# Patient Record
Sex: Female | Born: 1970 | Race: White | Hispanic: No | Smoking: Never smoker
Health system: Southern US, Community
[De-identification: ages and names within clinical notes are randomized; demographics above are authoritative.]

## PROBLEM LIST (undated history)

## (undated) DIAGNOSIS — F329 Major depressive disorder, single episode, unspecified: Secondary | ICD-10-CM

## (undated) DIAGNOSIS — R001 Bradycardia, unspecified: Secondary | ICD-10-CM

## (undated) DIAGNOSIS — F32A Depression, unspecified: Secondary | ICD-10-CM

## (undated) DIAGNOSIS — F319 Bipolar disorder, unspecified: Secondary | ICD-10-CM

## (undated) HISTORY — PX: ABDOMINAL HYSTERECTOMY: SHX81

---

## 2018-03-23 ENCOUNTER — Emergency Department
Admission: EM | Admit: 2018-03-23 | Discharge: 2018-03-23 | Disposition: A | Discharge status: Home / Self Care | Payer: Self-pay | Attending: Emergency Medicine | Admitting: Emergency Medicine

## 2018-03-23 ENCOUNTER — Emergency Department: Payer: Self-pay

## 2018-03-23 ENCOUNTER — Other Ambulatory Visit: Payer: Self-pay

## 2018-03-23 DIAGNOSIS — R0789 Other chest pain: Secondary | ICD-10-CM | POA: Insufficient documentation

## 2018-03-23 DIAGNOSIS — R079 Chest pain, unspecified: Secondary | ICD-10-CM

## 2018-03-23 LAB — CBC
HCT: 39.9 % (ref 36.0–46.0)
Hemoglobin: 12.4 g/dL (ref 12.0–15.0)
MCH: 26.2 pg (ref 26.0–34.0)
MCHC: 31.1 g/dL (ref 30.0–36.0)
MCV: 84.4 fL (ref 80.0–100.0)
NRBC: 0 % (ref 0.0–0.2)
PLATELETS: 243 10*3/uL (ref 150–400)
RBC: 4.73 MIL/uL (ref 3.87–5.11)
RDW: 14.1 % (ref 11.5–15.5)
WBC: 11.3 10*3/uL — AB (ref 4.0–10.5)

## 2018-03-23 LAB — BASIC METABOLIC PANEL
Anion gap: 9 (ref 5–15)
BUN: 13 mg/dL (ref 6–20)
CHLORIDE: 108 mmol/L (ref 98–111)
CO2: 23 mmol/L (ref 22–32)
CREATININE: 0.69 mg/dL (ref 0.44–1.00)
Calcium: 9.1 mg/dL (ref 8.9–10.3)
Glucose, Bld: 120 mg/dL — ABNORMAL HIGH (ref 70–99)
Potassium: 3.6 mmol/L (ref 3.5–5.1)
SODIUM: 140 mmol/L (ref 135–145)

## 2018-03-23 LAB — TROPONIN I

## 2018-03-23 MED ORDER — DIAZEPAM 5 MG PO TABS: 5.0000 mg | ORAL_TABLET | Freq: Three times a day (TID) | ORAL | 0 refills | Status: AC | PRN

## 2018-03-23 MED ORDER — IBUPROFEN 600 MG PO TABS: 600.0000 mg | ORAL_TABLET | Freq: Three times a day (TID) | ORAL | 0 refills | Status: AC | PRN

## 2018-03-23 NOTE — ED Notes (Signed)
Green lav and red tubes sent to lab. Green and lav little short

## 2018-03-23 NOTE — ED Triage Notes (Signed)
Pt comes via POV from work with c/o chest pain. Pt states central stabbing chest pain that radiated into her left arm. Pt states hx of bradycardia and felt her HR drop while at work. Pt also states dizziness and nausea.

## 2018-03-23 NOTE — ED Notes (Signed)
Patient transported to X-ray 

## 2018-03-23 NOTE — ED Provider Notes (Signed)
[facility] Emergency Department Provider Note       Time seen: ----------------------------------------- 12:08 PM on 03/23/2018 -----------------------------------------   I have reviewed the triage vital signs and the nursing notes.  HISTORY   Chief Complaint Chest Pain    HPI Nicole Bradley is a 47 y.o. female with no significant past medical history who presents to the ED for chest pain.  Patient states she has had central stabbing chest pain that radiates into her left arm.  She also has had some dizziness and nausea, felt her heart rate dropped while she was at work today.  Patient was told in the past at [facility] that she had low heart rate but that it was not worrisome and there was no treatment.  Nothing makes her symptoms better or worse.  History reviewed. No pertinent past medical history.  There are no active problems to display for this patient.   History reviewed. No pertinent surgical history.  Allergies Oxycodone and Tramadol  Social History Social History   Tobacco Use  . Smoking status: Not on file  Substance Use Topics  . Alcohol use: Not on file  . Drug use: Not on file   Review of Systems Constitutional: Negative for fever. Cardiovascular: Positive for chest pain, slow heart rate Respiratory: Negative for shortness of breath. Gastrointestinal: Negative for abdominal pain, vomiting and diarrhea. Musculoskeletal: Negative for back pain. Skin: Negative for rash. Neurological: Negative for headaches, focal weakness or numbness.  All systems negative/normal/unremarkable except as stated in the HPI  ____________________________________________   PHYSICAL EXAM:  VITAL SIGNS: ED Triage Vitals  Enc Vitals Group     BP 03/23/18 1159 119/84     Pulse Rate 03/23/18 1159 87     Resp 03/23/18 1159 19     Temp 03/23/18 1159 97.8 F (36.6 C)     Temp Source 03/23/18 1159 Oral     SpO2 03/23/18 1159 97 %     Weight 03/23/18 1200  260 lb (117.9 kg)     Height 03/23/18 1200 5\' 2"  (1.575 m)     Head Circumference --      Peak Flow --      Pain Score 03/23/18 1159 2     Pain Loc --      Pain Edu? --      Excl. in GC? --    Constitutional: Alert and oriented. Well appearing and in no distress. Eyes: Conjunctivae are normal. Normal extraocular movements. ENT   Head: Normocephalic and atraumatic.   Nose: No congestion/rhinnorhea.   Mouth/Throat: Mucous membranes are moist.   Neck: No stridor. Cardiovascular: Normal rate, regular rhythm. No murmurs, rubs, or gallops. Respiratory: Normal respiratory effort without tachypnea nor retractions. Breath sounds are clear and equal bilaterally. No wheezes/rales/rhonchi. Gastrointestinal: Soft and nontender. Normal bowel sounds Musculoskeletal: Nontender with normal range of motion in extremities. No lower extremity tenderness nor edema. Neurologic:  Normal speech and language. No gross focal neurologic deficits are appreciated.  Skin:  Skin is warm, dry and intact. No rash noted. Psychiatric: Mood and affect are normal. Speech and behavior are normal.  ____________________________________________  EKG: Interpreted by me.  Sinus rhythm rate 89 bpm, normal PR interval, normal QRS, normal QT  ____________________________________________  ED COURSE:  As part of my medical decision making, I reviewed the following data within the electronic MEDICAL RECORD NUMBER History obtained from family if available, nursing notes, old chart and ekg, as well as notes from prior ED visits. Patient presented for  chest pain with reports of low heart rate, we will assess with labs and imaging as indicated at this time.   Procedures ____________________________________________   LABS (pertinent positives/negatives)  Labs Reviewed  BASIC METABOLIC PANEL - Abnormal; Notable for the following components:      Result Value   Glucose, Bld 120 (*)    All other components within normal  limits  CBC - Abnormal; Notable for the following components:   WBC 11.3 (*)    All other components within normal limits  TROPONIN I  TROPONIN I    RADIOLOGY Images were viewed by me  Chest x-ray Is unremarkable ____________________________________________  DIFFERENTIAL DIAGNOSIS   Musculoskeletal pain, GERD, anxiety, unstable angina, MI, PE, pneumothorax  FINAL ASSESSMENT AND PLAN  Chest pain   Plan: The patient had presented for chest pain. Patient's labs were negative including repeat troponin testing. Patient's imaging was negative.  Unclear etiology for her symptoms.  She will be referred to cardiology for outpatient follow-up.   Ulice Dash, MD   Note: This note was generated in part or whole with voice recognition software. Voice recognition is usually quite accurate but there are transcription errors that can and very often do occur. I apologize for any typographical errors that were not detected and corrected.     Emily Filbert, MD 03/23/18 1515

## 2018-06-18 ENCOUNTER — Ambulatory Visit (INDEPENDENT_AMBULATORY_CARE_PROVIDER_SITE_OTHER): Payer: Worker's Compensation

## 2018-06-18 ENCOUNTER — Ambulatory Visit
Admission: EM | Admit: 2018-06-18 | Discharge: 2018-06-18 | Disposition: A | Discharge status: Home / Self Care | Payer: Worker's Compensation | Attending: Family Medicine | Admitting: Family Medicine

## 2018-06-18 ENCOUNTER — Other Ambulatory Visit: Payer: Self-pay

## 2018-06-18 DIAGNOSIS — S8002XA Contusion of left knee, initial encounter: Secondary | ICD-10-CM | POA: Diagnosis not present

## 2018-06-18 DIAGNOSIS — W1800XA Striking against unspecified object with subsequent fall, initial encounter: Secondary | ICD-10-CM

## 2018-06-18 DIAGNOSIS — M25562 Pain in left knee: Secondary | ICD-10-CM

## 2018-06-18 HISTORY — DX: Bradycardia, unspecified: R00.1

## 2018-06-18 HISTORY — DX: Bipolar disorder, unspecified: F31.9

## 2018-06-18 HISTORY — DX: Depression, unspecified: F32.A

## 2018-06-18 HISTORY — DX: Major depressive disorder, single episode, unspecified: F32.9

## 2018-06-18 MED ORDER — HYDROCODONE-ACETAMINOPHEN 5-325 MG PO TABS: ORAL_TABLET | ORAL | 0 refills | Status: AC

## 2018-06-18 NOTE — ED Triage Notes (Signed)
Worker's comp. Pt tripped over a box while working and fell onto left knee. Pt with limping gait in triage and pain 10/10

## 2018-06-18 NOTE — Discharge Instructions (Signed)
Rest, ice, elevation °

## 2018-06-18 NOTE — ED Provider Notes (Signed)
[facility]    CSN: [id number] Arrival date & time: 06/18/18  0818     History   Chief Complaint Chief Complaint  Patient presents with  . Knee Pain    HPI Nicole Bradley is a 48 y.o. female.   48 yo female with a c/o left knee pain after tripping over a box at work and falling onto her left knee.   The history is provided by the patient.  Knee Pain    Past Medical History:  Diagnosis Date  . Bipolar affective disorder (HCC)   . Bradycardia   . Depression     There are no active problems to display for this patient.   Past Surgical History:  Procedure Laterality Date  . ABDOMINAL HYSTERECTOMY      OB History   No obstetric history on file.      Home Medications    Prior to Admission medications   Medication Sig Start Date End Date Taking? Authorizing Provider  diazepam (VALIUM) 5 MG tablet Take 1 tablet (5 mg total) by mouth every 8 (eight) hours as needed for muscle spasms. 03/23/18   Emily Filbert, MD  HYDROcodone-acetaminophen (NORCO/VICODIN) 5-325 MG tablet 1-2 tabs po qd prn 06/18/18   Payton Mccallum, MD  ibuprofen (ADVIL,MOTRIN) 600 MG tablet Take 1 tablet (600 mg total) by mouth every 8 (eight) hours as needed. 03/23/18   Emily Filbert, MD    Family History History reviewed. No pertinent family history.  Social History Social History   Tobacco Use  . Smoking status: Never Smoker  . Smokeless tobacco: Never Used  Substance Use Topics  . Alcohol use: Not Currently  . Drug use: Never     Allergies   Oxycodone and Tramadol   Review of Systems Review of Systems   Physical Exam Triage Vital Signs ED Triage Vitals  Enc Vitals Group     BP 06/18/18 0853 119/79     Pulse Rate 06/18/18 0853 76     Resp 06/18/18 0853 18     Temp 06/18/18 0853 97.7 F (36.5 C)     Temp Source 06/18/18 0853 Oral     SpO2 06/18/18 0853 98 %     Weight 06/18/18 0855 259 lb (117.5 kg)     Height 06/18/18 0855 5\' 2"  (1.575 m)   Head Circumference --      Peak Flow --      Pain Score 06/18/18 0854 10     Pain Loc --      Pain Edu? --      Excl. in GC? --    No data found.  Updated Vital Signs BP 119/79 (BP Location: Right Arm)   Pulse 76   Temp 97.7 F (36.5 C) (Oral)   Resp 18   Ht 5\' 2"  (1.575 m)   Wt 117.5 kg   SpO2 98%   BMI 47.37 kg/m   Visual Acuity Right Eye Distance:   Left Eye Distance:   Bilateral Distance:    Right Eye Near:   Left Eye Near:    Bilateral Near:     Physical Exam Vitals signs and nursing note reviewed.  Constitutional:      General: She is not in acute distress.    Appearance: She is not toxic-appearing or diaphoretic.  Musculoskeletal:     Left knee: She exhibits swelling (mild anterior). She exhibits no effusion, no ecchymosis, no deformity, no laceration, no erythema, normal alignment, no LCL laxity, normal patellar mobility,  normal meniscus and no MCL laxity. Tenderness (anterior) found.  Neurological:     Mental Status: She is alert.      UC Treatments / Results  Labs (all labs ordered are listed, but only abnormal results are displayed) Labs Reviewed - No data to display  EKG None  Radiology Dg Knee Complete 4 Views Left  Result Date: 06/18/2018 CLINICAL DATA:  Pain in left ant knee joint this morning after falling forward tripping over a box. 2 prior injuries in past but didn't break anything EXAM: LEFT KNEE - COMPLETE 4+ VIEW COMPARISON:  None. FINDINGS: No evidence of fracture, dislocation, or joint effusion. No evidence of arthropathy or other focal bone abnormality. Soft tissues are unremarkable. IMPRESSION: Negative. Electronically Signed   By: Corlis Leak M.D.   On: 06/18/2018 09:35    Procedures Procedures (including critical care time)  Medications Ordered in UC Medications - No data to display  Initial Impression / Assessment and Plan / UC Course  I have reviewed the triage vital signs and the nursing notes.  Pertinent labs & imaging  results that were available during my care of the patient were reviewed by me and considered in my medical decision making (see chart for details).      Final Clinical Impressions(s) / UC Diagnoses   Final diagnoses:  Contusion of left knee, initial encounter    ED Prescriptions    Medication Sig Dispense Auth. Provider   HYDROcodone-acetaminophen (NORCO/VICODIN) 5-325 MG tablet 1-2 tabs po qd prn 6 tablet Payton Mccallum, MD      1. x-ray results (negative for fracture) and diagnosis reviewed with patient 2. rx as per orders above; reviewed possible side effects, interactions, risks and benefits  3. Recommend supportive treatment with rest, ice, elevation, work restrictions 4. Follow-up at [facility] in next 3-5 days   Controlled Substance Prescriptions [place] Controlled Substance Registry consulted? Not Applicable   Payton Mccallum, MD 06/18/18 1158

## 2019-11-06 IMAGING — CR DG KNEE COMPLETE 4+V*L*
4 series · 4 of 4 positions shown · non-contrast
Comparison: None.

CLINICAL DATA: Pain in left ant knee joint this morning after
falling forward tripping over a box. 2 prior injuries in past but
didn't break anything

EXAM:
LEFT KNEE - COMPLETE 4+ VIEW

[knee ap]
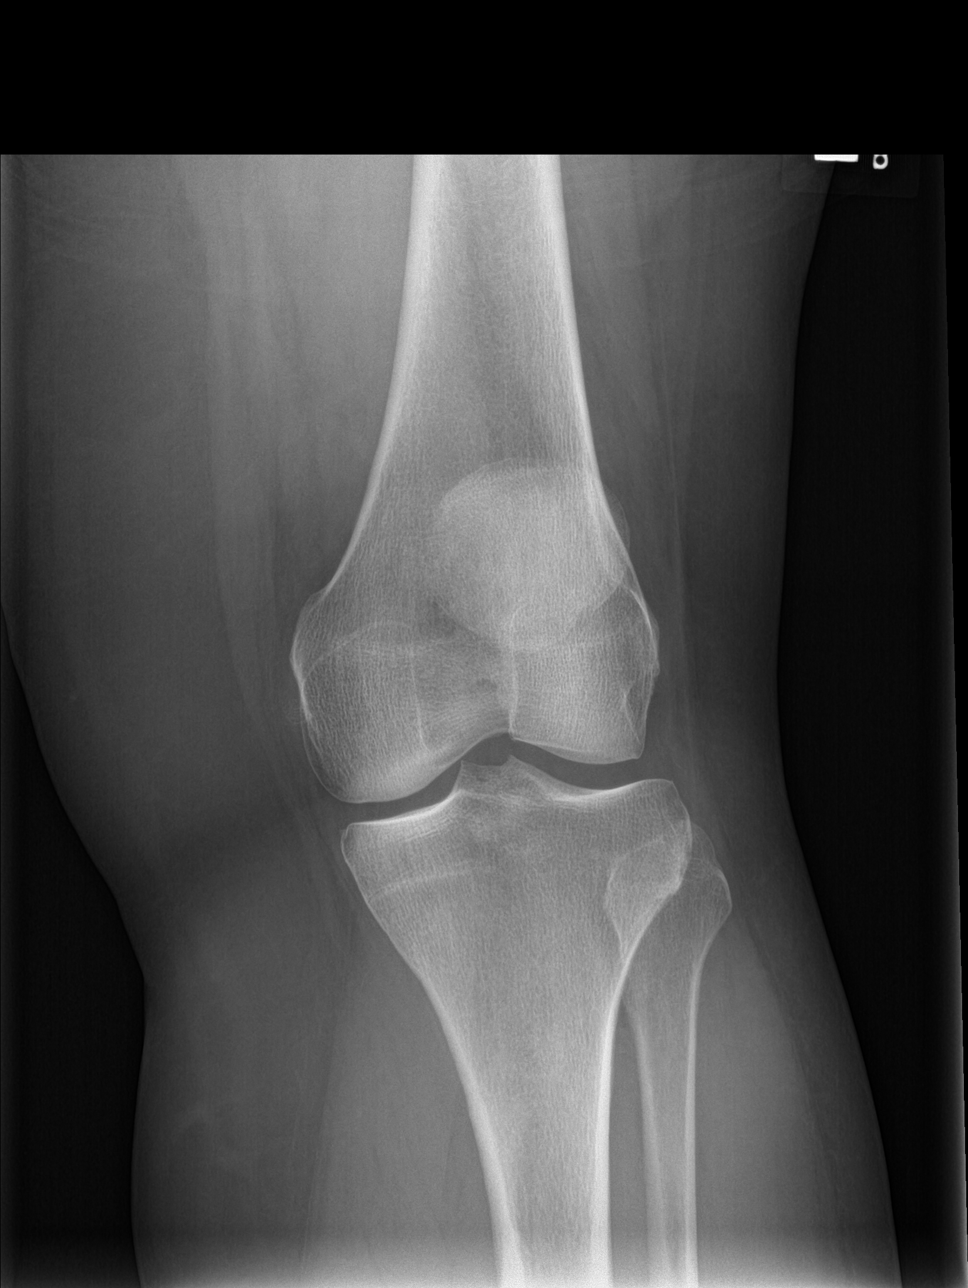

[knee lat]
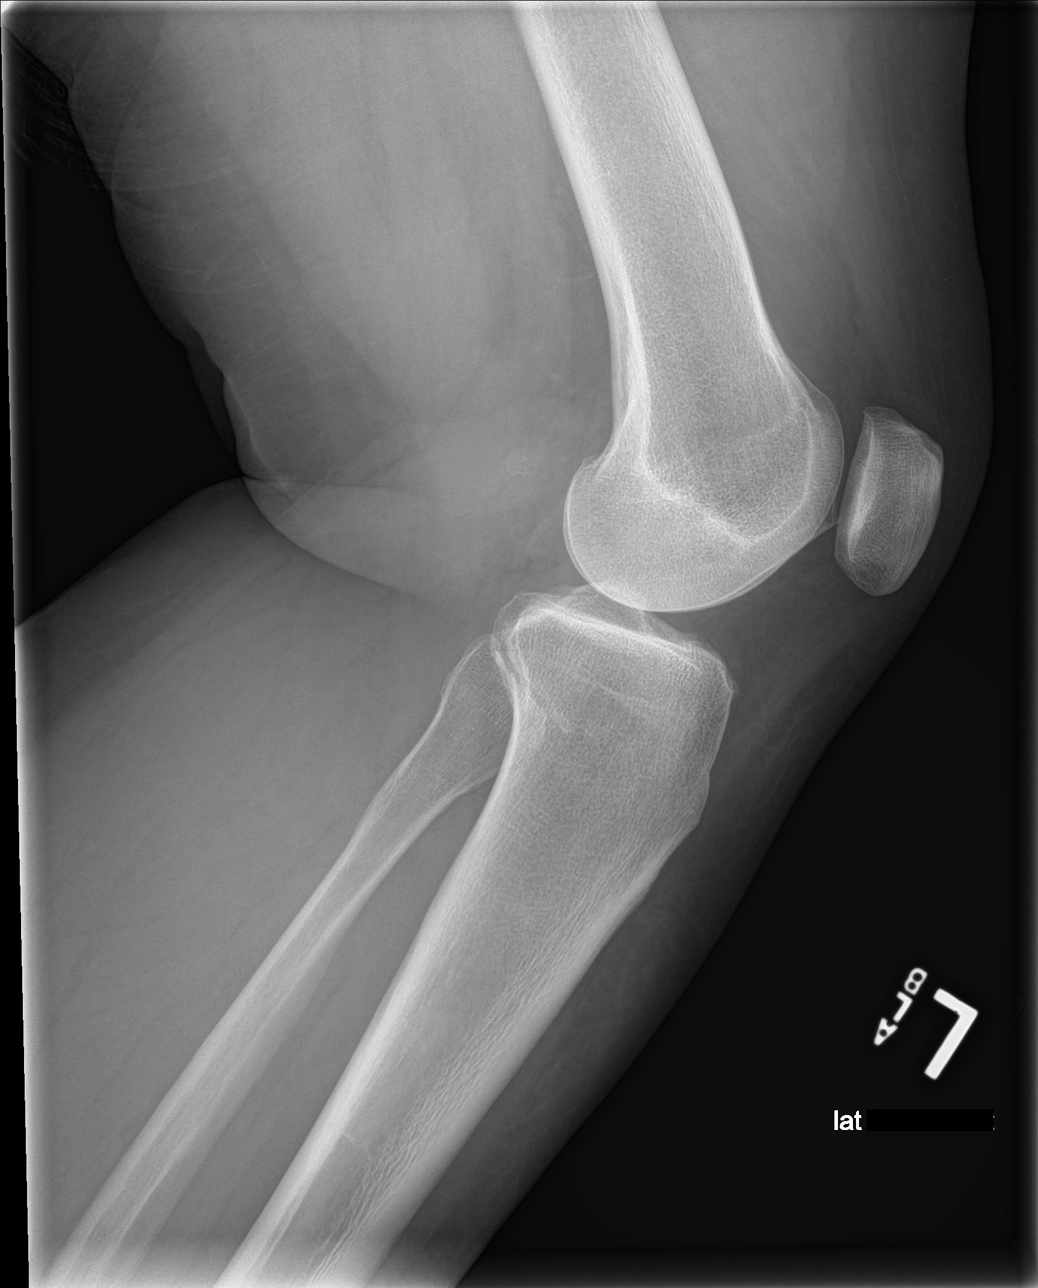

[tunnel]
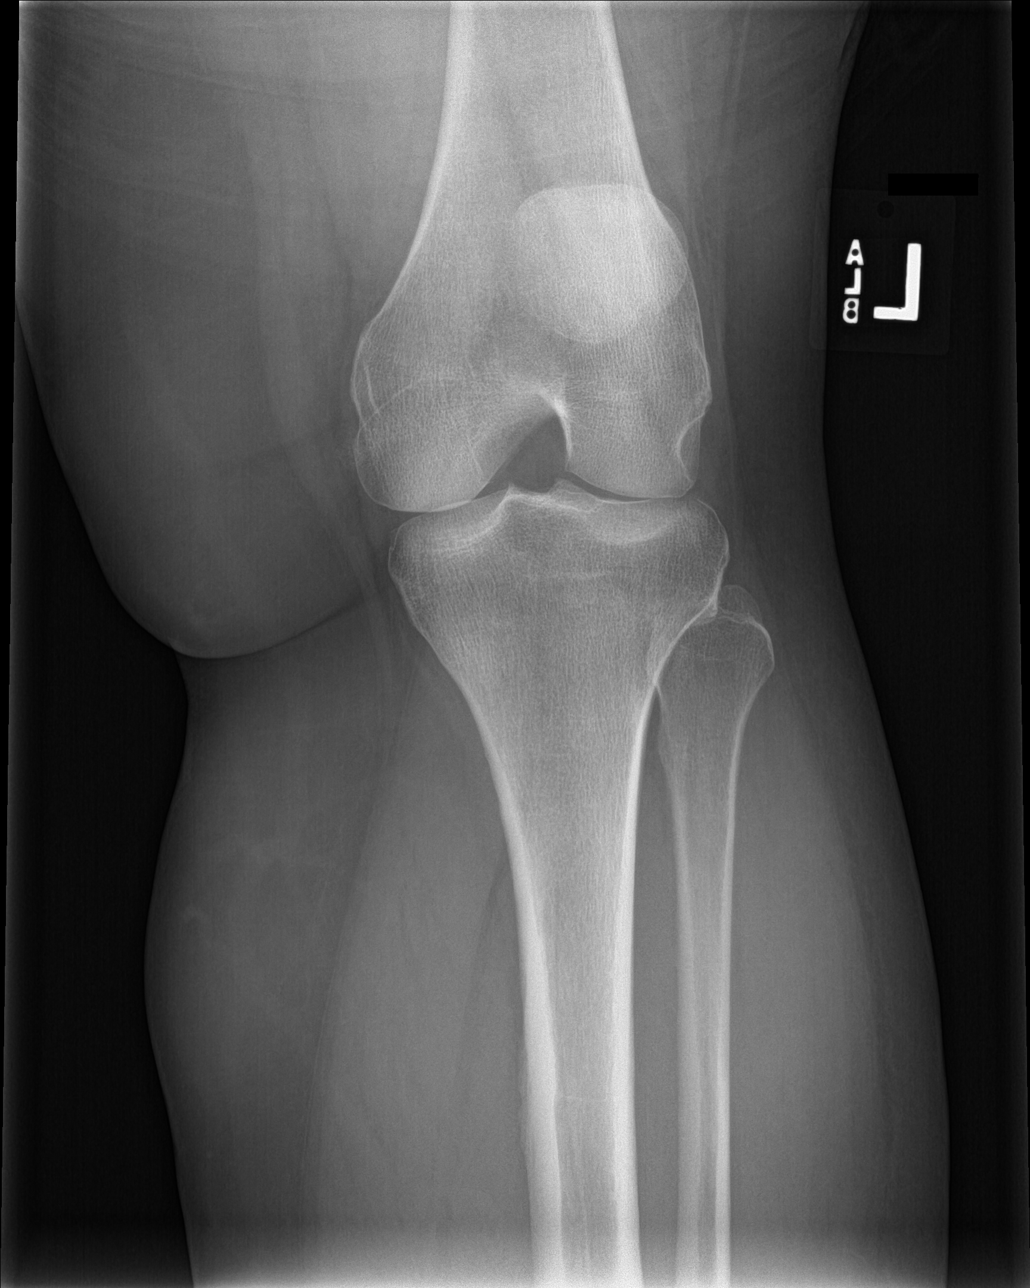

[patella skyline]
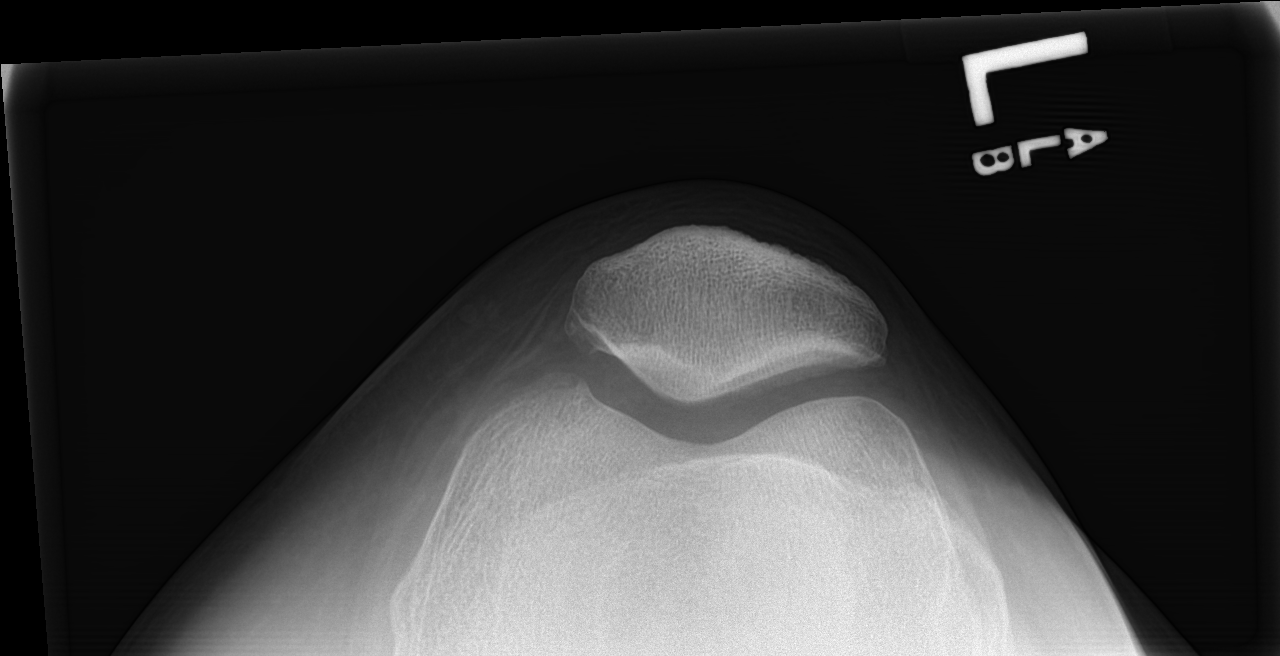

[4 of 4 positions shown; findings below may reference images not displayed]

FINDINGS: No evidence of fracture, dislocation, or joint effusion. No evidence
of arthropathy or other focal bone abnormality. Soft tissues are
unremarkable.
IMPRESSION: Negative.

## 2022-02-08 ENCOUNTER — Emergency Department
Admission: EM | Admit: 2022-02-08 | Discharge: 2022-02-09 | Disposition: A | Discharge status: Home / Self Care | Payer: PRIVATE HEALTH INSURANCE | Attending: Emergency Medicine | Admitting: Emergency Medicine

## 2022-02-08 ENCOUNTER — Emergency Department: Payer: PRIVATE HEALTH INSURANCE

## 2022-02-08 ENCOUNTER — Other Ambulatory Visit: Payer: Self-pay

## 2022-02-08 DIAGNOSIS — R0789 Other chest pain: Secondary | ICD-10-CM

## 2022-02-08 DIAGNOSIS — I309 Acute pericarditis, unspecified: Secondary | ICD-10-CM | POA: Diagnosis not present

## 2022-02-08 DIAGNOSIS — R079 Chest pain, unspecified: Secondary | ICD-10-CM | POA: Diagnosis present

## 2022-02-08 LAB — CBC
HCT: 41.5 % (ref 36.0–46.0)
Hemoglobin: 13 g/dL (ref 12.0–15.0)
MCH: 26.1 pg (ref 26.0–34.0)
MCHC: 31.3 g/dL (ref 30.0–36.0)
MCV: 83.3 fL (ref 80.0–100.0)
Platelets: 165 10*3/uL (ref 150–400)
RBC: 4.98 MIL/uL (ref 3.87–5.11)
RDW: 14.6 % (ref 11.5–15.5)
WBC: 11.6 10*3/uL — ABNORMAL HIGH (ref 4.0–10.5)
nRBC: 0 % (ref 0.0–0.2)

## 2022-02-08 LAB — BASIC METABOLIC PANEL
Anion gap: 7 (ref 5–15)
BUN: 17 mg/dL (ref 6–20)
CO2: 24 mmol/L (ref 22–32)
Calcium: 9.4 mg/dL (ref 8.9–10.3)
Chloride: 109 mmol/L (ref 98–111)
Creatinine, Ser: 0.64 mg/dL (ref 0.44–1.00)
GFR, Estimated: >60 mL/min (ref >60)
Glucose, Bld: 130 mg/dL — ABNORMAL HIGH (ref 70–99)
Potassium: 3.9 mmol/L (ref 3.5–5.1)
Sodium: 140 mmol/L (ref 135–145)

## 2022-02-08 LAB — TROPONIN I (HIGH SENSITIVITY)
Troponin I (High Sensitivity): <2 ng/L (ref <18)
Troponin I (High Sensitivity): 3 ng/L (ref <18)

## 2022-02-08 MED ORDER — IOHEXOL 350 MG/ML SOLN
100.0000 mL | Freq: Once | INTRAVENOUS | Status: AC | PRN
  Administered 2022-02-08: 100 mL via INTRAVENOUS

## 2022-02-08 MED ORDER — ONDANSETRON HCL 4 MG/2ML IJ SOLN
4.0000 mg | Freq: Once | INTRAMUSCULAR | Status: AC
  Administered 2022-02-08: 4 mg via INTRAVENOUS
  Filled 2022-02-08: qty 2

## 2022-02-08 MED ORDER — MORPHINE SULFATE (PF) 4 MG/ML IV SOLN
4.0000 mg | Freq: Once | INTRAVENOUS | Status: AC
  Administered 2022-02-08: 4 mg via INTRAVENOUS
  Filled 2022-02-08: qty 1

## 2022-02-08 NOTE — ED Provider Notes (Signed)
[facility] Provider Note    Event Date/Time   First MD Initiated Contact with Patient 02/08/22 2053     (approximate)   History   Chief Complaint Chest Pain   HPI  Nicole Bradley is a 51 y.o. female with past medical history of bipolar disorder and bradycardia who presents to the ED complaining of chest pain.  Patient reports that she had sudden onset of sharp pain in the center of her chest around 4:30 PM this afternoon.  Pain has been increasing in severity since then, now radiating to the area between her shoulder blades.  Pain is exacerbated when she goes to take a deep breath and she reports feeling slightly short of breath.  She denies any fevers or cough, has not had any pain or swelling in her legs.  She denies any history of similar symptoms, does report seeing a cardiologist for "tachycardia and bradycardia," but denies any other significant cardiac history.     Physical Exam   Triage Vital Signs: ED Triage Vitals  Enc Vitals Group     BP      Pulse      Resp      Temp      Temp src      SpO2      Weight      Height      Head Circumference      Peak Flow      Pain Score      Pain Loc      Pain Edu?      Excl. in GC?     Most recent vital signs: Vitals:   02/08/22 2239 02/08/22 2330  BP: (!) 105/58 (!) 90/54  Pulse: 80 80  Resp: 18 16  Temp:    SpO2: 95% 90%    Constitutional: Alert and oriented. Eyes: Conjunctivae are normal. Head: Atraumatic. Nose: No congestion/rhinnorhea. Mouth/Throat: Mucous membranes are moist.  Cardiovascular: Normal rate, regular rhythm. Grossly normal heart sounds.  2+ radial pulses bilaterally. Respiratory: Normal respiratory effort.  No retractions. Lungs CTAB.  No chest wall tenderness to palpation. Gastrointestinal: Soft and nontender. No distention. Musculoskeletal: No lower extremity tenderness nor edema.  Neurologic:  Normal speech and language. No gross focal neurologic deficits are  appreciated.    ED Results / Procedures / Treatments   Labs (all labs ordered are listed, but only abnormal results are displayed) Labs Reviewed  BASIC METABOLIC PANEL - Abnormal; Notable for the following components:      Result Value   Glucose, Bld 130 (*)    All other components within normal limits  CBC - Abnormal; Notable for the following components:   WBC 11.6 (*)    All other components within normal limits  TROPONIN I (HIGH SENSITIVITY)  TROPONIN I (HIGH SENSITIVITY)     EKG  ED ECG REPORT I, Chesley Noon, the attending physician, personally viewed and interpreted this ECG.   Date: 02/08/2022  EKG Time: 21:02  Rate: 93  Rhythm: normal sinus rhythm  Axis: Normal  Intervals:none  ST&T Change: ST elevation inferolaterally with PR depression, consider pericarditis, no reciprocal changes  RADIOLOGY CTA chest reviewed and interpreted by me with no pulmonary embolism or focal infiltrate.  PROCEDURES:  Critical Care performed: No  Procedures   MEDICATIONS ORDERED IN ED: Medications  morphine (PF) 4 MG/ML injection 4 mg (4 mg Intravenous Given 02/08/22 2121)  ondansetron (ZOFRAN) injection 4 mg (4 mg Intravenous Given 02/08/22 2121)  iohexol (OMNIPAQUE)  350 MG/ML injection 100 mL (100 mLs Intravenous Contrast Given 02/08/22 2225)     IMPRESSION / MDM / ASSESSMENT AND PLAN / ED COURSE  I reviewed the triage vital signs and the nursing notes.                              51 y.o. female with past medical history of of bipolar disorder and bradycardia who presents to the ED complaining of sudden onset chest pain around 430 this afternoon that is described as sharp, worse with a deep breath, and radiating to the area between her shoulder blades.  Patient's presentation is most consistent with acute presentation with potential threat to life or bodily function.  Differential diagnosis includes, but is not limited to, ACS, PE, dissection, pneumonia, pneumothorax,  pericarditis, musculoskeletal pain, GERD, and anxiety.  Patient nontoxic-appearing and in no acute distress, vital signs are unremarkable.  EKG shows normal sinus rhythm with some slight ST elevation inferolaterally with associated PR depression, seems most consistent with pericarditis, no reciprocal changes to suggest ACS.  Overall suspicion for ACS is low given atypical symptoms, we will further assess for PE with CTA of her chest given pleuritic pain.  Labs are pending at this time, patient took aspirin prior to arrival and had no relief with nitroglycerin given by EMS.  We will treat symptomatically with IV morphine and Zofran, reassess following labs and imaging.  CTA of chest is negative for PE or other acute process.  2 sets of troponin are negative and additional labs are also reassuring with no significant anemia, leukocytosis, electrolyte abnormality, or AKI.  Patient with partial improvement in pain following dose of morphine, she is appropriate for outpatient follow-up with cardiology for suspected pericarditis.  She was prescribed course of naproxen, counseled to return to the ED for new or worsening symptoms, patient agrees with plan.      FINAL CLINICAL IMPRESSION(S) / ED DIAGNOSES   Final diagnoses:  Atypical chest pain  Acute pericarditis, unspecified type     Rx / DC Orders   ED Discharge Orders          Ordered    Ambulatory referral to Cardiology        02/08/22 2359    naproxen (NAPROSYN) 500 MG tablet  2 times daily with meals        02/09/22 0000             Note:  This document was prepared using Dragon voice recognition software and [place]irr include unintentional dictation errors.   Chesley Noon, MD 02/09/22 0001

## 2022-02-08 NOTE — ED Triage Notes (Signed)
Chest pain onset ~1630 today now with radiation into her back between shoulder blades. 324 asa at home and 1 nitro with ems en route. IV placed by EMS. Denies h/a or acute dizziness. Pt alert and oriented following commands appropriately. Breathing unlabored speaking in full sentences. Reports SOB. Denies cough or fever.

## 2022-02-09 MED ORDER — NAPROXEN 500 MG PO TABS: 500.0000 mg | ORAL_TABLET | Freq: Two times a day (BID) | ORAL | 0 refills | Status: AC

## 2022-02-09 NOTE — ED Notes (Signed)
Pt verbalizes understanding of discharge instructions.  Pt states she has an appointment with her cardiologist in 1 to 2 weeks.

## 2022-05-02 DIAGNOSIS — Z139 Encounter for screening, unspecified: Secondary | ICD-10-CM

## 2022-05-02 LAB — GLUCOSE, POCT (MANUAL RESULT ENTRY): POC Glucose: 120 mg/dl — AB (ref 70–99)

## 2022-05-02 NOTE — Congregational Nurse Program (Signed)
  Dept: [phone]   Congregational Nurse Program Note  Date of Encounter: 05/02/2022  Past Medical History: Past Medical History:  Diagnosis Date   Bipolar affective disorder (HCC)    Bradycardia    Depression     Encounter Details:  CNP Questionnaire - 05/02/22 1315       Questionnaire   Ask client: Do you give verbal consent for me to treat you today? Yes    Student Assistance N/A    Location Patient Information systems manager, [employer]    Visit Setting with Hospital doctor    Patient Status Unknown   rents Civil engineer, contracting or [employer]    Insurance/Financial Assistance Referral N/A    Medication N/A    Medical Provider Yes    Screening Referrals Made [facility]    Medical Referrals Made [facility]    Medical Appointment Made N/A    Recently w/o PCP, now 1st time PCP visit completed due to CNs referral or appointment made N/A    Food Have Food Insecurities    Transportation N/A    Housing/Utilities N/A    Economist N/A    Interventions Advocate/Support;Educate;Navigate Healthcare System    Screenings CN Performed Blood Pressure;Blood Glucose    Sent Client to Lab for: N/A    Did client attend any of the following based off CNs referral or appointments made? N/A    ED Visit Averted N/A    Life-Saving Intervention Made N/A            Initial visit to nurse only clinic at [employer] requesting BP and glucose check. States PCP is [facility] internal medicine. Receives ongoing [employer] and meds for bipolar, depression and mood swings. Also followed by [facility] for heart arrhythmias. Has history of prediabetes. Admits that she drinks several icee flavored drinks while at work each night. Glucose 120 non fasting today; BP 104/64 pulse 66 regular. Hasn't had vision screening and agrees to referral to prevent blindness [place] as her health insurance doesn't have vision benefits. Reinforced importance of limiting sweet drinks  and adding a healthy protein when eating carbs as a first steps to managing glucose levels. Client has appointment with cardiology today. Follow up with this clinic prn. Referral emailed to Prevent Blindness. Rhermann, RN

## 2022-09-03 ENCOUNTER — Ambulatory Visit: Payer: PRIVATE HEALTH INSURANCE | Admitting: Internal Medicine

## 2022-09-03 NOTE — Progress Notes (Unsigned)
Sleep Medicine   Office Visit  Patient Name: Nicole Bradley DOB: 53/06/1972 MRN [id number]    Chief Complaint: ***  Brief History:  Nicole presents for initial sleep consult with a *** history of ***. Sleep quality is ***. This is noted *** nights. The patient's bed partner reports  *** at night. The patient relates the following symptoms: *** are also present. The patient goes to sleep at *** and wakes up at ***. Sleep quality is *** when outside home environment.  Patient has noted *** of his legs at night.  The patient  relates *** behavior during the night.  The patient *** a history of psychiatric problems. The Epworth Sleepiness Score is *** out of 24 .  The patient relates  Cardiovascular risk factors include: *** The patient reports ***    ROS  General: (-) fever, (-) chills, (-) night sweat Nose and Sinuses: (-) nasal stuffiness or itchiness, (-) postnasal drip, (-) nosebleeds, (-) sinus trouble. Mouth and Throat: (-) sore throat, (-) hoarseness. Neck: (-) swollen glands, (-) enlarged thyroid, (-) neck pain. Respiratory: *** cough, *** shortness of breath, *** wheezing. Neurologic: *** numbness, *** tingling. Psychiatric: *** anxiety, *** depression Sleep behavior: ***sleep paralysis ***hypnogogic hallucinations ***dream enactment      ***vivid dreams ***cataplexy ***night terrors ***sleep walking   Current Medication: No outpatient encounter medications on file as of 07/12/2022.   No facility-administered encounter medications on file as of 07/12/2022.    Surgical History: *** The histories are not reviewed yet. Please review them in the "History" navigator section and refresh this SmartLink.  Medical History: No past medical history on file.  Family History: Non contributory to the present illness  Social History: Social History   Socioeconomic History   Marital status: Not on file    Spouse name: Not on file   Number of children: Not on file   Years of  education: Not on file   Highest education level: Not on file  Occupational History   Not on file  Tobacco Use   Smoking status: Not on file   Smokeless tobacco: Not on file  Substance and Sexual Activity   Alcohol use: Not on file   Drug use: Not on file   Sexual activity: Not on file  Other Topics Concern   Not on file  Social History Narrative   Not on file   Social Determinants of Health   Financial Resource Strain: Not on file  Food Insecurity: Not on file  Transportation Needs: Not on file  Physical Activity: Not on file  Stress: Not on file  Social Connections: Not on file  Intimate Partner Violence: Not on file    Vital Signs: There were no vitals taken for this visit. There is no height or weight on file to calculate BMI.   Examination: General Appearance: The patient is well-developed, well-nourished, and in no distress. Neck Circumference: *** Skin: Gross inspection of skin unremarkable. Head: normocephalic, no gross deformities. Eyes: no gross deformities noted. ENT: ears appear grossly normal Neurologic: Alert and oriented. No involuntary movements.    STOP BANG RISK ASSESSMENT S (snore) Have you been told that you snore?     YES/N   T (tired) Are you often tired, fatigued, or sleepy during the day?   YES/NO  O (obstruction) Do you stop breathing, choke, or gasp during sleep? YES/NO   P (pressure) Do you have or are you being treated for high blood pressure? YES/NO   B (BMI) Is   your body index greater than 35 kg/m? YES/NO   A (age) Are you 53 years old or older? YES/NO   N (neck) Do you have a neck circumference greater than 16 inches?   YES/NO   G (gender) Are you a female? YES/NO   TOTAL STOP/BANG "YES" ANSWERS                                                                A STOP-Bang score of 2 or less is considered low risk, and a score of 5 or more is high risk for having either moderate or severe OSA. For people who score 3 or 4,  doctors Sligh need to perform further assessment to determine how likely they are to have OSA.         EPWORTH SLEEPINESS SCALE:  Scale:  (0)= no chance of dozing; (1)= slight chance of dozing; (2)= moderate chance of dozing; (3)= high chance of dozing  Chance  Situtation    Sitting and reading: ***    Watching TV: ***    Sitting Inactive in public: ***    As a passenger in car: ***      Lying down to rest: ***    Sitting and talking: ***    Sitting quielty after lunch: ***    In a car, stopped in traffic: ***   TOTAL SCORE:   *** out of 24    SLEEP STUDIES:  ***   LABS: No results found for this or any previous visit (from the past 2160 hour(s)).  Radiology: Patient was never admitted.  No results found.  No results found.    Assessment and Plan: There are no problems to display for this patient.    PLAN OSA:   Patient evaluation suggests high risk of sleep disordered breathing due to *** Patient has comorbid cardiovascular risk factors including: *** which could be exacerbated by pathologic sleep-disordered breathing.  Suggest: *** to assess/treat the patient's sleep disordered breathing. The patient was also counselled on *** to optimize sleep health.  PLAN hypersomnia:  Patient evaluation suggests significant daytime hypersomnia.  The Epworth Sleepiness Score is elevated at *** out of 24. Patient *** drowsy driving. The patient *** MVA due to sleepiness.  The patient *** restless leg symptoms which exacerbate *** for *** nights per week. The patient *** periodic limb movements which exacerbate ***  for *** nights per week. Suggest: ***  Also suggest ***  PLAN insomnia:  Patient evaluation suggests *** insomnia. This is a chronic disorder. This has been a concern for *** and causes impaired daytime functioning. The patient exhibits comorbid ***  The history *** suggest the insomnia predates the use of hypnotic medications. The symptoms *** with the  discontinuation of these medications. There is no obvious medical, psychiatric or pharmacologic abuse issues ot account for the insomnia.  Treatment recommendations include: *** The patient should maintain a sleep log and calculate total sleep time for 1-2 weeks. Set bed and wake times for achieve 85% sleep efficiency for one week. Once this is achieved  time in bed can be gradually increased. A pharmacologic treatment approach would include a trial of *** for the next ***  months. During this time the patient is to maintain a sleep diary to   track progress.    ***  General Counseling: I have discussed the findings of the evaluation and examination with Nicole.  I have also discussed any further diagnostic evaluation thatmay be needed or ordered today. Nicole verbalizes understanding of the findings of todays visit. We also reviewed his medications today and discussed drug interactions and side effects including but not limited excessive drowsiness and altered mental states. We also discussed that there is always a risk not just to him but also people around him. he has been encouraged to call the office with any questions or concerns that should arise related to todays visit.  No orders of the defined types were placed in this encounter.       I have personally obtained a history, evaluated the patient, evaluated pertinent data, formulated the assessment and plan and placed orders.    Areen Trautner A Laketha Leopard, MD FCCP Diplomate ABMS Pulmonary and Critical Care Medicine Sleep medicine  

## 2022-10-31 DIAGNOSIS — Z139 Encounter for screening, unspecified: Secondary | ICD-10-CM

## 2022-10-31 LAB — GLUCOSE, POCT (MANUAL RESULT ENTRY): POC Glucose: 125 mg/dl — AB (ref 70–99)

## 2022-10-31 NOTE — Congregational Nurse Program (Signed)
  Dept: [phone]   Congregational Nurse Program Note  Date of Encounter: 10/31/2022  [facility]. Agrees to BP and glucose screening. Follow up on Vision referral- went to an eye doctor that didn't accept the voucher from [employer] or Medicaid--was told by that eye dr that follow up was needed due to findings that day but declined providing glasses until she could pay for them out of pocket since they didn't accept vouchers.  She had medicaid briefly and intended on following up with a dr who accepted M/C; now Medicaid has been discontinued due to Medicaid dropping. Has misplaced voucher but is confident that it's in her house. Encouraged her to find and follow up with a vision provider listed on the voucher letter. Agrees to this plan. BP 118/82; glucose fasting 125. Co re importance of eating protein and regularly. Acknowledges being told in past that she is prediabetic. Has appt with PCP in June. RTC as needed for nurse monitoring. Rhermann, RN Past Medical History: Past Medical History:  Diagnosis Date   Bipolar affective disorder (HCC)    Bradycardia    Depression     Encounter Details:  CNP Questionnaire - 10/31/22 1245       Questionnaire   Ask client: Do you give verbal consent for me to treat you today? Yes    Student Assistance N/A    Location Patient Information systems manager, [employer]    Visit Setting with Client Organization    Patient Status Unknown   lives in rented mobile home with adult daughter and her fam   Insurance Uninsured ([employer]/Self-Pay/Medicaid Family Planning)    Insurance/Financial Assistance Referral Medicaid    Medication N/A    Medical Provider Yes    Screening Referrals Made Vision    Medical Referrals Made Vision    Medical Appointment Made N/A    Recently w/o PCP, now 1st time PCP visit completed due to CNs referral or appointment made N/A    Food Have Food Insecurities     Transportation N/A    Housing/Utilities N/A    Economist N/A    Interventions Advocate/Support;Navigate Healthcare System;Educate    Abnormal to Normal Screening Since Last CN Visit N/A    Screenings CN Performed Blood Pressure;Blood Glucose    Sent Client to Lab for: N/A    Did client attend any of the following based off CNs referral or appointments made? Screening    ED Visit Averted N/A    Life-Saving Intervention Made N/A

## 2023-07-31 LAB — GLUCOSE, POCT (MANUAL RESULT ENTRY): POC Glucose: 171 mg/dL — AB (ref 70–99)

## 2023-07-31 NOTE — Congregational Nurse Program (Signed)
  Dept: [phone]   Congregational Nurse Program Note  Date of Encounter: 07/31/2023 Client in to nurse only clinic today @ [employer] [facility] requesting BP and glucose screening. Client verbalized she had much stress due to home situation. Client states she if off of psych medications and she has a scheduled doctors visit for next month and is working with pharmacist to see if she can get more medications until she sees doctor next month. Nutrition education given to increase protein in diet - stated she had cereal today for breakfast. Educated client on taking medications regularly. Delynn Flavin RN Past Medical History: Past Medical History:  Diagnosis Date   Bipolar affective disorder [facility])    Bradycardia    Depression     Encounter Details:  Community Questionnaire - 07/31/23 1457       Questionnaire   Ask client: Do you give verbal consent for me to treat you today? Yes    Student Assistance N/A    Location Patient Information systems manager, [employer]    Encounter Setting CN site    Population Status Unknown   lives in rented mobile home with adult daughter and her Furniture conservator/restorer or [employer]    Insurance/Financial Assistance Referral N/A    Medication N/A    Medical Provider Yes    Screening Referrals Made N/A    Medical Referrals Made N/A    Medical Appointment Completed Vision    CNP Interventions Advocate/Support;Counsel;Educate    Screenings CN Performed Blood Pressure;Blood Glucose    ED Visit Averted N/A    Life-Saving Intervention Made N/A
